# Patient Record
Sex: Male | Born: 1985 | Race: Black or African American | Hispanic: No | Marital: Single | Smoking: Current every day smoker
Health system: Southern US, Community
[De-identification: ages and names within clinical notes are randomized; demographics above are authoritative.]

---
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
## 2021-11-30 ENCOUNTER — Encounter: Payer: Self-pay | Admitting: Emergency Medicine

## 2021-11-30 ENCOUNTER — Other Ambulatory Visit: Payer: Self-pay

## 2021-11-30 ENCOUNTER — Emergency Department
Admission: EM | Admit: 2021-11-30 | Discharge: 2021-11-30 | Disposition: A | Discharge status: Home / Self Care | Payer: 59 | Attending: Emergency Medicine | Admitting: Emergency Medicine

## 2021-11-30 DIAGNOSIS — J40 Bronchitis, not specified as acute or chronic: Secondary | ICD-10-CM | POA: Diagnosis not present

## 2021-11-30 DIAGNOSIS — J189 Pneumonia, unspecified organism: Secondary | ICD-10-CM | POA: Insufficient documentation

## 2021-11-30 DIAGNOSIS — R0981 Nasal congestion: Secondary | ICD-10-CM | POA: Diagnosis present

## 2021-11-30 DIAGNOSIS — Z20822 Contact with and (suspected) exposure to covid-19: Secondary | ICD-10-CM | POA: Diagnosis not present

## 2021-11-30 DIAGNOSIS — R000 Tachycardia, unspecified: Secondary | ICD-10-CM | POA: Insufficient documentation

## 2021-11-30 DIAGNOSIS — J22 Unspecified acute lower respiratory infection: Secondary | ICD-10-CM

## 2021-11-30 LAB — RESP PANEL BY RT-PCR (FLU A&B, COVID) ARPGX2
Influenza A by PCR: NEGATIVE
Influenza B by PCR: NEGATIVE
SARS Coronavirus 2 by RT PCR: NEGATIVE

## 2021-11-30 MED ORDER — ALBUTEROL SULFATE HFA 108 (90 BASE) MCG/ACT IN AERS: 2.0000 | INHALATION_SPRAY | Freq: Four times a day (QID) | RESPIRATORY_TRACT | 2 refills | Status: AC | PRN

## 2021-11-30 MED ORDER — BENZONATATE 100 MG PO CAPS: 100.0000 mg | ORAL_CAPSULE | Freq: Three times a day (TID) | ORAL | 0 refills | Status: AC | PRN

## 2021-11-30 MED ORDER — AZITHROMYCIN 250 MG PO TABS: ORAL_TABLET | ORAL | 0 refills | Status: AC

## 2021-11-30 MED ORDER — AMOXICILLIN-POT CLAVULANATE 875-125 MG PO TABS: 1.0000 | ORAL_TABLET | Freq: Two times a day (BID) | ORAL | 0 refills | Status: AC

## 2021-11-30 NOTE — ED Provider Notes (Signed)
[facility] Provider Note    None    (approximate)   History   Chief Complaint URI   HPI Darrell Small is a 36 y.o. male, no significant medical history, presents emergency department for evaluation of URI symptoms.  Patient reports congestion, painful cough, and chills x2 days.  He states it is particularly worse today.  Endorses mild chest tightness/shortness of breath.  He states that he has been around a few of the people who have had similar symptoms.  Denies chest pain, abdominal pain, flank pain, nausea/vomiting, diarrhea, dysuria, rash/lesions, headache, or dizziness/lightheadedness.  History Limitations: No limitations.        Physical Exam  Triage Vital Signs: ED Triage Vitals  Enc Vitals Group     BP 11/30/21 1503 137/81     Pulse Rate 11/30/21 1502 (!) 110     Resp 11/30/21 1502 16     Temp 11/30/21 1502 99.4 F (37.4 C)     Temp Source 11/30/21 1502 Oral     SpO2 11/30/21 1503 94 %     Weight 11/30/21 1503 300 lb (136.1 kg)     Height 11/30/21 1503 5\' 7"  (1.702 m)     Head Circumference --      Peak Flow --      Pain Score 11/30/21 1502 10     Pain Loc --      Pain Edu? --      Excl. in [place]? --     Most recent vital signs: Vitals:   11/30/21 1502 11/30/21 1503  BP:  137/81  Pulse: (!) 110   Resp: 16   Temp: 99.4 F (37.4 C)   SpO2:  94%    General: Awake, NAD.  Skin: Warm, dry. No rashes or lesions.  Eyes: PERRL. Conjunctivae normal.  CV: Good peripheral perfusion.  Resp: Normal effort.  Mild rhonchi appreciated bilaterally in apices to bases. Abd: Soft, non-tender. No distention.  Neuro: At baseline. No gross neurological deficits.  Musculoskeletal: Normal ROM of all extremities.  Focused Exam: Throat exam unremarkable.  No significant erythema, tonsillar swelling, or exudates.  Uvula midline.  Physical Exam    ED Results / Procedures / Treatments  Labs (all labs ordered are listed, but only abnormal  results are displayed) Labs Reviewed  RESP PANEL BY RT-PCR (FLU A&B, COVID) [id number]     EKG N/A.    RADIOLOGY  ED Provider Interpretation: N/A.  No results found.  PROCEDURES:  Critical Care performed: N/A.  Procedures    MEDICATIONS ORDERED IN ED: Medications - No data to display   IMPRESSION / MDM / [place] / ED COURSE  I reviewed the triage vital signs and the nursing notes.                              Differential diagnosis includes, but is not limited to, COVID-19, influenza, viral URI, commune acquired pneumonia.  ED Course Patient appears well, mildly tachycardic at 110, otherwise normal vitals.  NAD.  Respiratory panel PCR negative.  Assessment/Plan Presentation consistent with bronchitis versus commune acquired pneumonia.  Physical exam is notable for some mild rhonchi in the apices and bases bilaterally.  He appears well clinically, however given his symptoms and exam findings, we will treat him for possible developing community-acquired pneumonia.  Provided with prescriptions for Augmentin, azithromycin, albuterol, and benzonatate.  Provided him with a work note as well.  Recommend  they follow-up with his primary care provider in 48 hours if his symptoms do not improve.  He was amenable to this plan.  Will discharge.  Provided the patient with anticipatory guidance, return precautions, and educational material. Encouraged the patient to return to the emergency department at any time if they begin to experience any new or worsening symptoms. Patient expressed understanding and agreed with the plan.   Patient's presentation is most consistent with acute complicated illness / injury requiring diagnostic workup.       FINAL CLINICAL IMPRESSION(S) / ED DIAGNOSES   Final diagnoses:  Lower respiratory infection (e.g., bronchitis, pneumonia, pneumonitis, pulmonitis)     Rx / DC Orders   ED Discharge Orders          Ordered     amoxicillin-clavulanate (AUGMENTIN) 875-125 MG tablet  2 times daily        11/30/21 1533    azithromycin (ZITHROMAX) 250 MG tablet  Daily        11/30/21 1533    albuterol (VENTOLIN HFA) 108 (90 Base) MCG/ACT inhaler  Every 6 hours PRN        11/30/21 1533    benzonatate (TESSALON PERLES) 100 MG capsule  3 times daily PRN        11/30/21 1533             Note:  This document was prepared using Dragon voice recognition software and may include unintentional dictation errors.   Varney Daily, [place] 11/30/21 Nida Boatman    Shaune Pollack, MD 12/02/21 [phone]

## 2021-11-30 NOTE — ED Triage Notes (Signed)
Sinus congestion, painful cough x 2 days.  Worse today.  Chills.

## 2021-11-30 NOTE — ED Provider Triage Note (Signed)
Emergency Medicine Provider Triage Evaluation Note  Darrell Small , a 36 y.o. male  was evaluated in triage.  Pt complains of cough, congestion and sorethroat.  Girlfriend here with same sx.   Review of Systems  Positive: Fever, facial pain Negative:   Physical Exam  BP 137/81   Pulse (!) 110   Temp 99.4 F (37.4 C) (Oral)   Resp 16   Ht 5\' 7"  (1.702 m)   Wt 136.1 kg   SpO2 94%   BMI 46.99 kg/m  Gen:   Awake, no distress  cough+ Resp:  Normal effort  MSK:   Moves extremities without difficulty  Other:    Medical Decision Making  Medically screening exam initiated at 3:03 PM.  Appropriate orders placed.  Darrell Small was informed that the remainder of the evaluation will be completed by another provider, this initial triage assessment does not replace that evaluation, and the importance of remaining in the ED until their evaluation is complete.     Johnn Hai, PA-C 11/30/21 1505

## 2021-11-30 NOTE — Discharge Instructions (Addendum)
-  Take the antibiotics as prescribed for the entire course.  -Take the benzonatate as needed for cough  -You may review the results of your covid test online. If positive, please isolate for 5 days to prevent further spread of the virus.   -Return to the emergency department at any time if you begin to experience any new or worsening symptoms.
